# Patient Record
Sex: Female | Born: 1980 | Race: White | Hispanic: No | Smoking: Never smoker
Health system: Southern US, Community
[De-identification: ages and names within clinical notes are randomized; demographics above are authoritative.]

## PROBLEM LIST (undated history)

## (undated) DIAGNOSIS — H9190 Unspecified hearing loss, unspecified ear: Secondary | ICD-10-CM

## (undated) HISTORY — PX: MIDDLE EAR SURGERY: SHX713

## (undated) HISTORY — DX: Unspecified hearing loss, unspecified ear: H91.90

---

## 2007-03-19 ENCOUNTER — Encounter: Admission: RE | Admit: 2007-03-19 | Discharge: 2007-03-19 | Payer: Self-pay | Admitting: Family Medicine

## 2008-02-26 ENCOUNTER — Inpatient Hospital Stay (HOSPITAL_COMMUNITY): Admission: AD | Admit: 2008-02-26 | Discharge: 2008-02-28 | Payer: Self-pay | Admitting: Obstetrics and Gynecology

## 2009-03-13 ENCOUNTER — Encounter: Admission: RE | Admit: 2009-03-13 | Discharge: 2009-03-13 | Payer: Self-pay | Admitting: Family Medicine

## 2009-09-14 ENCOUNTER — Encounter: Admission: RE | Admit: 2009-09-14 | Discharge: 2009-09-14 | Payer: Self-pay | Admitting: Family Medicine

## 2010-07-17 ENCOUNTER — Observation Stay (HOSPITAL_COMMUNITY)
Admission: RE | Admit: 2010-07-17 | Discharge: 2010-07-17 | Disposition: A | Discharge status: Home / Self Care | Payer: Managed Care, Other (non HMO) | Source: Ambulatory Visit | Attending: Obstetrics and Gynecology | Admitting: Obstetrics and Gynecology

## 2010-07-17 DIAGNOSIS — O4700 False labor before 37 completed weeks of gestation, unspecified trimester: Principal | ICD-10-CM | POA: Insufficient documentation

## 2010-07-17 DIAGNOSIS — R1031 Right lower quadrant pain: Secondary | ICD-10-CM | POA: Insufficient documentation

## 2010-08-08 ENCOUNTER — Inpatient Hospital Stay (HOSPITAL_COMMUNITY)
Admission: AD | Admit: 2010-08-08 | Payer: Self-pay | Source: Home / Self Care | Attending: Obstetrics and Gynecology | Admitting: Obstetrics and Gynecology

## 2010-08-08 ENCOUNTER — Inpatient Hospital Stay (HOSPITAL_COMMUNITY)
Admission: AD | Admit: 2010-08-08 | Payer: Managed Care, Other (non HMO) | Source: Ambulatory Visit | Admitting: Obstetrics and Gynecology

## 2010-08-09 ENCOUNTER — Inpatient Hospital Stay (HOSPITAL_COMMUNITY)
Admission: RE | Admit: 2010-08-09 | Discharge: 2010-08-10 | DRG: 775 | Disposition: A | Discharge status: Home / Self Care | Payer: Managed Care, Other (non HMO) | Source: Ambulatory Visit | Attending: Obstetrics and Gynecology | Admitting: Obstetrics and Gynecology

## 2010-08-09 DIAGNOSIS — O480 Post-term pregnancy: Principal | ICD-10-CM | POA: Diagnosis present

## 2010-08-09 LAB — CBC
Hemoglobin: 13.9 g/dL (ref 12.0–15.0)
MCHC: 34.3 g/dL (ref 30.0–36.0)
MCV: 92.7 fL (ref 78.0–100.0)

## 2010-08-10 LAB — CBC
HCT: 30.3 % — ABNORMAL LOW (ref 36.0–46.0)
MCHC: 33 g/dL (ref 30.0–36.0)
Platelets: 164 10*3/uL (ref 150–400)

## 2010-12-08 IMAGING — US US PELVIS COMPLETE
1 series · 13 of 25 positions shown · non-contrast
Comparison: Ultrasound of the pelvis of 03/19/2007

CLINICAL DATA: Right lower quadrant tenderness

TRANSABDOMINAL AND TRANSVAGINAL ULTRASOUND OF PELVIS
TECHNIQUE: Both transabdominal and transvaginal ultrasound
examinations of the pelvis were performed including evaluation of
the uterus, ovaries, adnexal regions, and pelvic cul-de-sac.

[Series 1: us pelvis complete · 0.24mm/px · 13 of 89 slices shown]
[im 1/89]
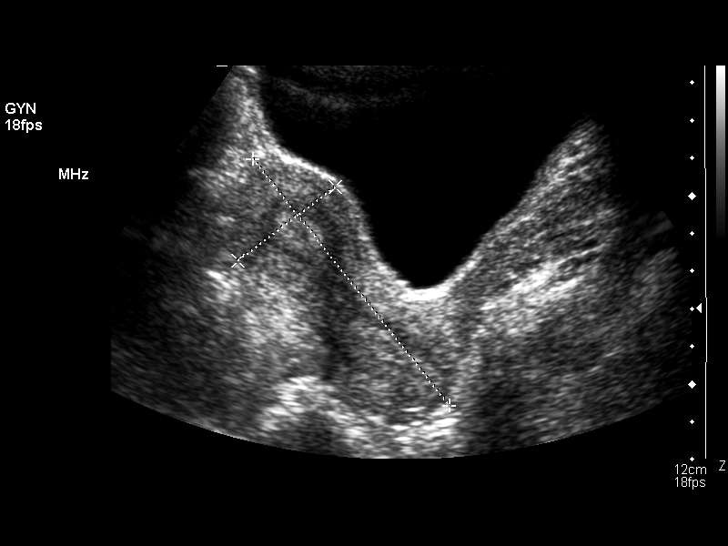
[im 8/89]
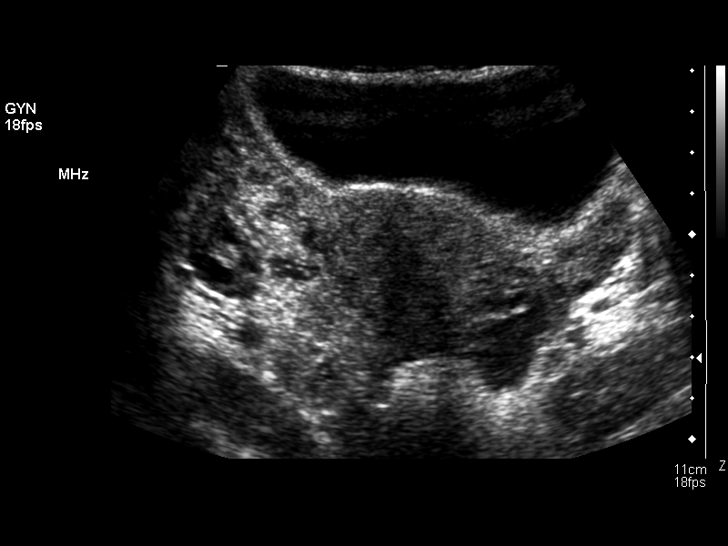
[im 15/89]
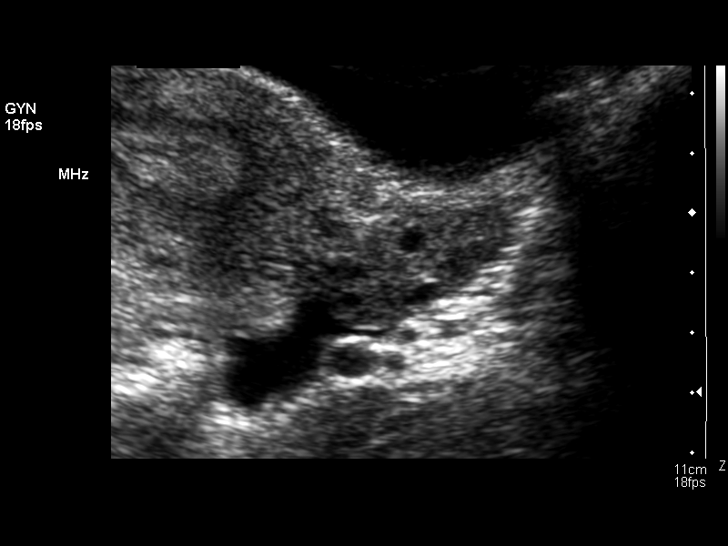
[im 23/89]
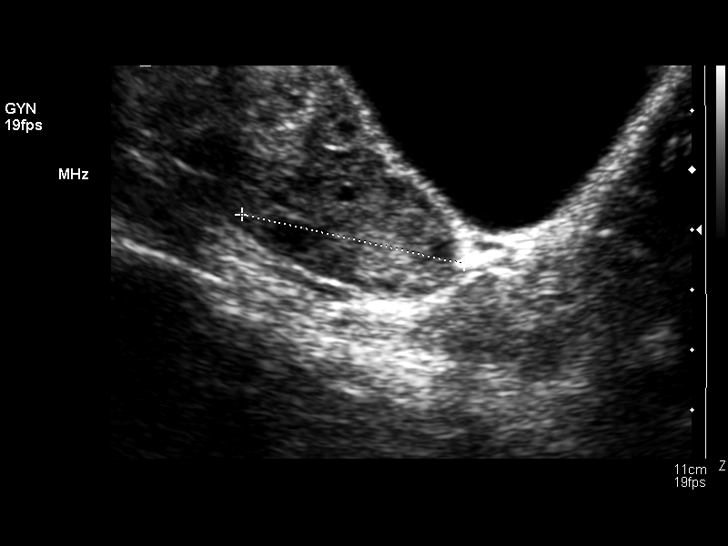
[im 30/89]
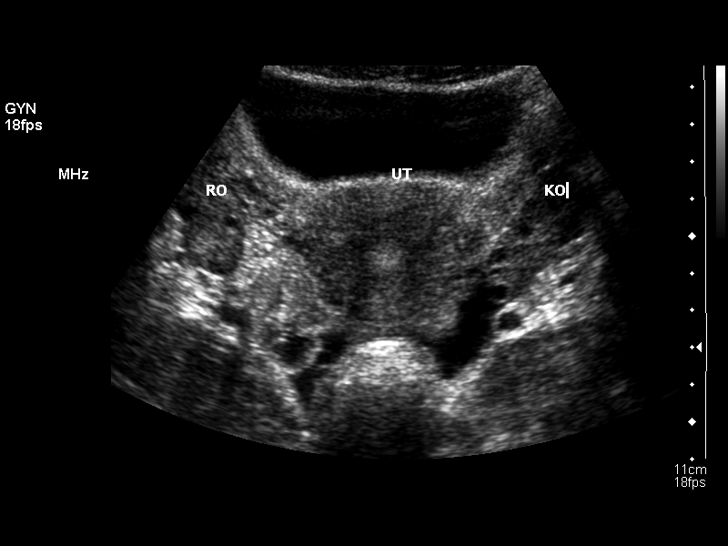
[im 37/89]
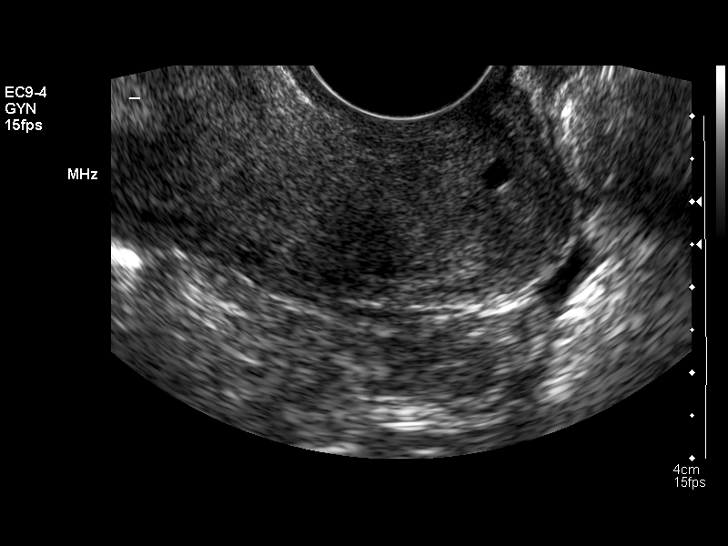
[im 45/89]
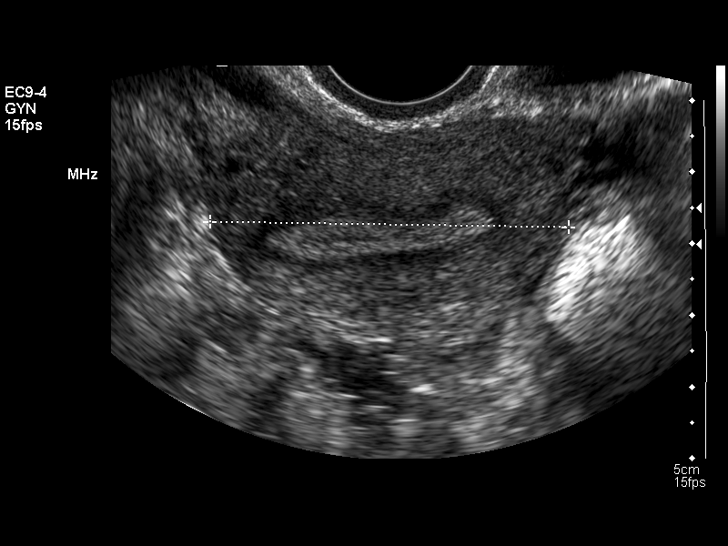
[im 52/89]
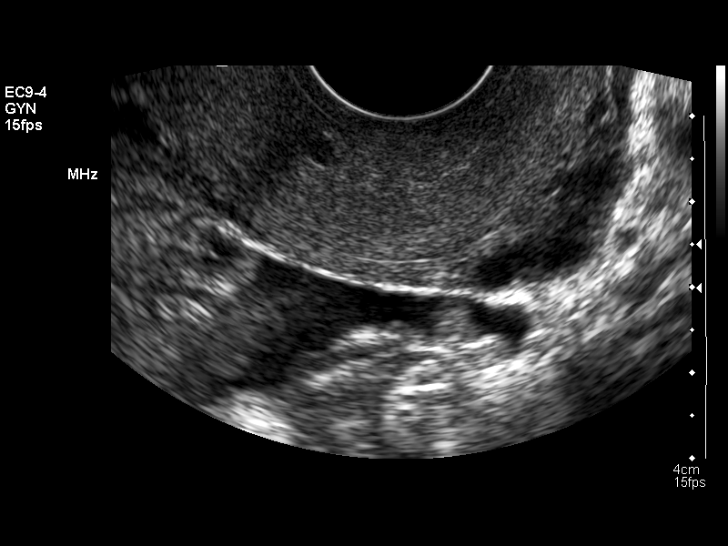
[im 59/89]
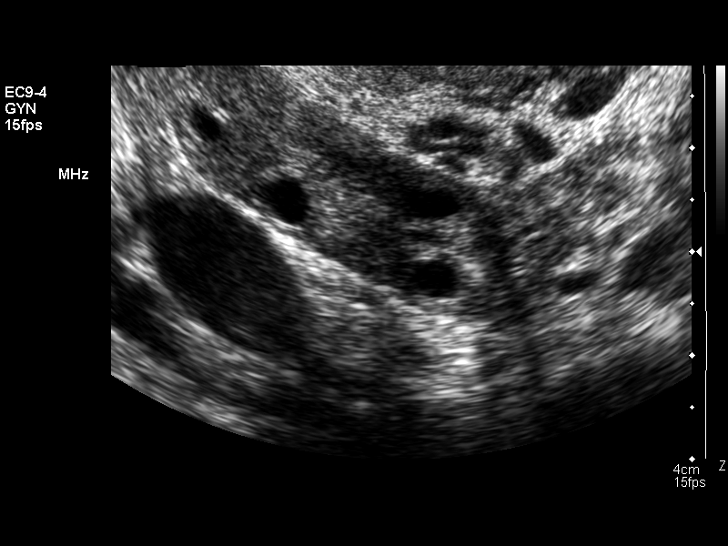
[im 67/89]
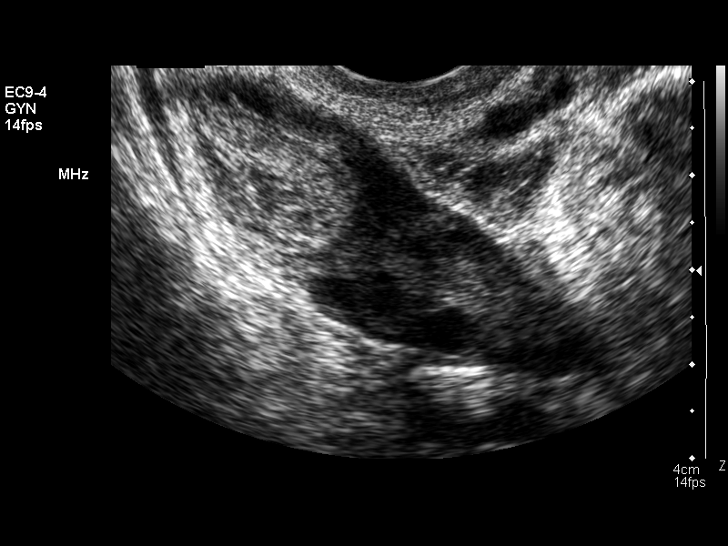
[im 74/89]
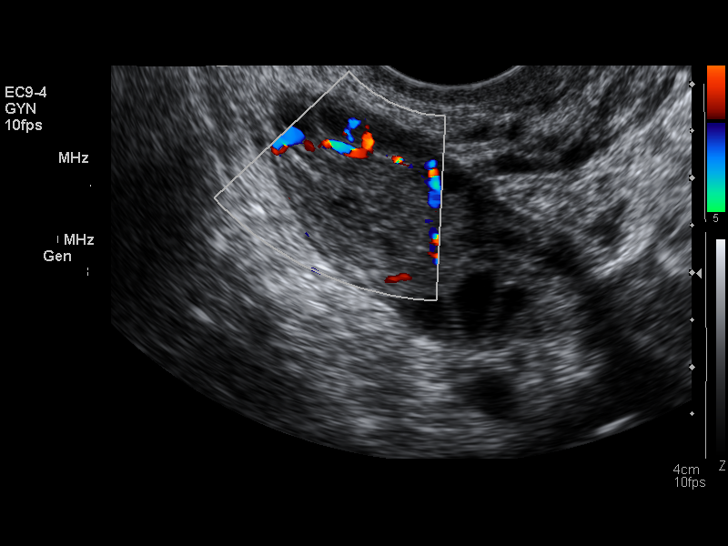
[im 81/89]
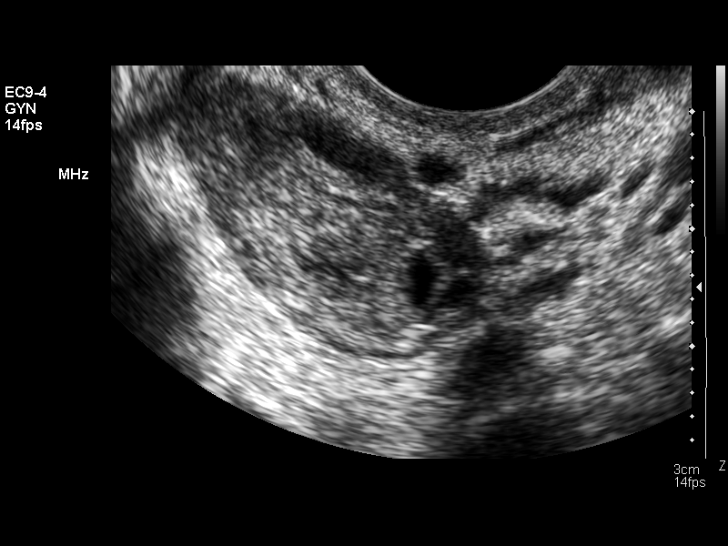
[im 89/89]
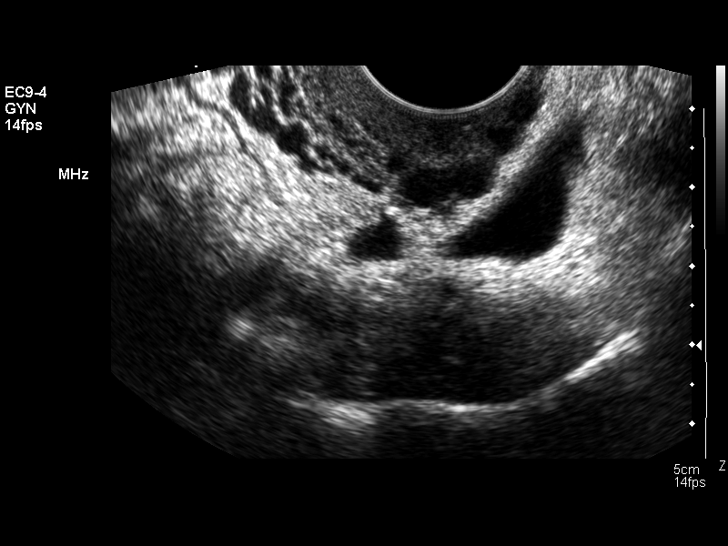

[13 of 25 positions shown; findings below may reference images not displayed]

FINDINGS: Uterus: The uterus measures 7.8 cm sagittally, with a depth of
cm and width of 5.0 cm.

Endometrium:The endometrium is normal measuring 6.6 mm in
thickness.

Right Ovary :The right ovary measures 5.2 x 1.7 x 2.9 cm.  There is
a slightly echogenic focus in the right ovary of 1.5 x 2.0 x
cm.  This does appear to have blood flow and therefore is not
consistent with a collapsed cyst.  Follow-up ultrasound is
recommended to assess stability.  Small follicles are present.

Left Ovary :The left ovary measures 4.6 x 1.4 x 2.3 cm with small
follicles.

Other Findings:  A small amount of free fluid is noted in the cul-
de-sac.
IMPRESSION: 1.  No abnormality of the uterus.  The endometrium is normal.
2.  Echogenic focus in the right ovary with some blood flow.
Recommend follow-up ultrasound to assess stability.
3.  Small ovarian follicles with small amount of free fluid.

## 2011-02-12 LAB — CBC
HCT: 45.5
Hemoglobin: 14.1
Hemoglobin: 15.3 — ABNORMAL HIGH
MCHC: 33.6
MCHC: 33.7
MCV: 96.2
Platelets: 215
Platelets: 246
RBC: 4.36
RBC: 4.72
RDW: 12.9
WBC: 14.4 — ABNORMAL HIGH
WBC: 20.5 — ABNORMAL HIGH

## 2011-02-12 LAB — RPR: RPR Ser Ql: NONREACTIVE

## 2011-09-19 LAB — OB RESULTS CONSOLE HEPATITIS B SURFACE ANTIGEN: Hepatitis B Surface Ag: NEGATIVE

## 2011-09-19 LAB — OB RESULTS CONSOLE RPR: RPR: NONREACTIVE

## 2012-03-17 LAB — OB RESULTS CONSOLE GBS: GBS: NEGATIVE

## 2012-03-22 ENCOUNTER — Inpatient Hospital Stay (HOSPITAL_COMMUNITY): Admission: AD | Admit: 2012-03-22 | Payer: Self-pay | Source: Ambulatory Visit | Admitting: Obstetrics and Gynecology

## 2012-04-03 ENCOUNTER — Telehealth (HOSPITAL_COMMUNITY): Payer: Self-pay | Admitting: *Deleted

## 2012-04-03 ENCOUNTER — Encounter (HOSPITAL_COMMUNITY): Payer: Self-pay | Admitting: *Deleted

## 2012-04-03 NOTE — Telephone Encounter (Signed)
Preadmission screen  

## 2012-04-05 ENCOUNTER — Encounter (HOSPITAL_COMMUNITY): Payer: Self-pay

## 2012-04-05 ENCOUNTER — Inpatient Hospital Stay (HOSPITAL_COMMUNITY)
Admission: RE | Admit: 2012-04-05 | Discharge: 2012-04-08 | DRG: 767 | Disposition: A | Discharge status: Home / Self Care | Payer: Managed Care, Other (non HMO) | Source: Ambulatory Visit | Attending: Obstetrics and Gynecology | Admitting: Obstetrics and Gynecology

## 2012-04-05 DIAGNOSIS — Z302 Encounter for sterilization: Secondary | ICD-10-CM

## 2012-04-05 LAB — CBC
Platelets: 189 10*3/uL (ref 150–400)
RBC: 4.43 MIL/uL (ref 3.87–5.11)
RDW: 12.7 % (ref 11.5–15.5)
WBC: 11.6 10*3/uL — ABNORMAL HIGH (ref 4.0–10.5)

## 2012-04-05 MED ORDER — LIDOCAINE HCL (PF) 1 % IJ SOLN: 30.0000 mL | INTRAMUSCULAR | Status: DC | PRN

## 2012-04-05 MED ORDER — CITRIC ACID-SODIUM CITRATE 334-500 MG/5ML PO SOLN
30.0000 mL | ORAL | Status: DC | PRN
  Administered 2012-04-06: 30 mL via ORAL
  Filled 2012-04-05: qty 15

## 2012-04-05 MED ORDER — ONDANSETRON HCL 4 MG/2ML IJ SOLN
4.0000 mg | Freq: Four times a day (QID) | INTRAMUSCULAR | Status: DC | PRN
  Administered 2012-04-06: 4 mg via INTRAVENOUS
  Filled 2012-04-05 (×2): qty 2

## 2012-04-05 MED ORDER — OXYTOCIN BOLUS FROM INFUSION
500.0000 mL | INTRAVENOUS | Status: DC
  Administered 2012-04-06: 500 mL via INTRAVENOUS

## 2012-04-05 MED ORDER — TERBUTALINE SULFATE 1 MG/ML IJ SOLN: 0.2500 mg | Freq: Once | INTRAMUSCULAR | Status: AC | PRN

## 2012-04-05 MED ORDER — OXYCODONE-ACETAMINOPHEN 5-325 MG PO TABS: 1.0000 | ORAL_TABLET | ORAL | Status: DC | PRN

## 2012-04-05 MED ORDER — LACTATED RINGERS IV SOLN: 500.0000 mL | INTRAVENOUS | Status: DC | PRN

## 2012-04-05 MED ORDER — FLEET ENEMA 7-19 GM/118ML RE ENEM: 1.0000 | ENEMA | RECTAL | Status: DC | PRN

## 2012-04-05 MED ORDER — OXYTOCIN 40 UNITS IN LACTATED RINGERS INFUSION - SIMPLE MED: 62.5000 mL/h | INTRAVENOUS | Status: DC

## 2012-04-05 MED ORDER — ACETAMINOPHEN 325 MG PO TABS
650.0000 mg | ORAL_TABLET | ORAL | Status: DC | PRN
  Administered 2012-04-06: 650 mg via ORAL
  Filled 2012-04-05: qty 2

## 2012-04-05 MED ORDER — LACTATED RINGERS IV SOLN
INTRAVENOUS | Status: DC
  Administered 2012-04-06: 08:00:00 via INTRAVENOUS

## 2012-04-05 MED ORDER — OXYTOCIN 40 UNITS IN LACTATED RINGERS INFUSION - SIMPLE MED
1.0000 m[IU]/min | INTRAVENOUS | Status: DC
  Administered 2012-04-05: 1 m[IU]/min via INTRAVENOUS
  Filled 2012-04-05: qty 1000

## 2012-04-05 MED ORDER — IBUPROFEN 600 MG PO TABS: 600.0000 mg | ORAL_TABLET | Freq: Four times a day (QID) | ORAL | Status: DC | PRN

## 2012-04-06 ENCOUNTER — Inpatient Hospital Stay (HOSPITAL_COMMUNITY): Payer: Managed Care, Other (non HMO) | Admitting: Anesthesiology

## 2012-04-06 ENCOUNTER — Encounter (HOSPITAL_COMMUNITY): Payer: Self-pay | Admitting: Anesthesiology

## 2012-04-06 ENCOUNTER — Encounter (HOSPITAL_COMMUNITY): Payer: Self-pay

## 2012-04-06 LAB — RPR: RPR Ser Ql: NONREACTIVE

## 2012-04-06 MED ORDER — DIPHENHYDRAMINE HCL 25 MG PO CAPS: 25.0000 mg | ORAL_CAPSULE | Freq: Four times a day (QID) | ORAL | Status: DC | PRN

## 2012-04-06 MED ORDER — PHENYLEPHRINE 40 MCG/ML (10ML) SYRINGE FOR IV PUSH (FOR BLOOD PRESSURE SUPPORT): 80.0000 ug | PREFILLED_SYRINGE | INTRAVENOUS | Status: DC | PRN

## 2012-04-06 MED ORDER — WITCH HAZEL-GLYCERIN EX PADS: 1.0000 "application " | MEDICATED_PAD | CUTANEOUS | Status: DC | PRN

## 2012-04-06 MED ORDER — IBUPROFEN 600 MG PO TABS
600.0000 mg | ORAL_TABLET | Freq: Four times a day (QID) | ORAL | Status: DC
  Administered 2012-04-06 – 2012-04-08 (×7): 600 mg via ORAL
  Filled 2012-04-06 (×9): qty 1

## 2012-04-06 MED ORDER — PRENATAL MULTIVITAMIN CH
1.0000 | ORAL_TABLET | Freq: Every day | ORAL | Status: DC
  Administered 2012-04-08: 1 via ORAL
  Filled 2012-04-06: qty 1

## 2012-04-06 MED ORDER — BENZOCAINE-MENTHOL 20-0.5 % EX AERO
1.0000 "application " | INHALATION_SPRAY | CUTANEOUS | Status: DC | PRN
  Administered 2012-04-06: 1 via TOPICAL
  Filled 2012-04-06 (×2): qty 56

## 2012-04-06 MED ORDER — SIMETHICONE 80 MG PO CHEW: 80.0000 mg | CHEWABLE_TABLET | ORAL | Status: DC | PRN

## 2012-04-06 MED ORDER — OXYCODONE-ACETAMINOPHEN 5-325 MG PO TABS
1.0000 | ORAL_TABLET | ORAL | Status: DC | PRN
Start: 2012-04-06 — End: 2012-04-08
  Administered 2012-04-07: 2 via ORAL
  Administered 2012-04-07 – 2012-04-08 (×4): 1 via ORAL
  Filled 2012-04-06 (×5): qty 1
  Filled 2012-04-06: qty 2

## 2012-04-06 MED ORDER — PHENYLEPHRINE 40 MCG/ML (10ML) SYRINGE FOR IV PUSH (FOR BLOOD PRESSURE SUPPORT)
80.0000 ug | PREFILLED_SYRINGE | INTRAVENOUS | Status: DC | PRN
  Filled 2012-04-06: qty 5

## 2012-04-06 MED ORDER — EPHEDRINE 5 MG/ML INJ
10.0000 mg | INTRAVENOUS | Status: AC | PRN
  Administered 2012-04-06 (×2): 10 mg via INTRAVENOUS

## 2012-04-06 MED ORDER — ZOLPIDEM TARTRATE 5 MG PO TABS: 5.0000 mg | ORAL_TABLET | Freq: Every evening | ORAL | Status: DC | PRN

## 2012-04-06 MED ORDER — DIPHENHYDRAMINE HCL 50 MG/ML IJ SOLN: 12.5000 mg | INTRAMUSCULAR | Status: DC | PRN

## 2012-04-06 MED ORDER — LANOLIN HYDROUS EX OINT: TOPICAL_OINTMENT | CUTANEOUS | Status: DC | PRN

## 2012-04-06 MED ORDER — DIBUCAINE 1 % RE OINT: 1.0000 "application " | TOPICAL_OINTMENT | RECTAL | Status: DC | PRN

## 2012-04-06 MED ORDER — ONDANSETRON HCL 4 MG/2ML IJ SOLN: 4.0000 mg | INTRAMUSCULAR | Status: DC | PRN

## 2012-04-06 MED ORDER — EPHEDRINE 5 MG/ML INJ
10.0000 mg | INTRAVENOUS | Status: DC | PRN
  Filled 2012-04-06 (×2): qty 4

## 2012-04-06 MED ORDER — ONDANSETRON HCL 4 MG PO TABS: 4.0000 mg | ORAL_TABLET | ORAL | Status: DC | PRN

## 2012-04-06 MED ORDER — LIDOCAINE HCL (PF) 1 % IJ SOLN
INTRAMUSCULAR | Status: DC | PRN
  Administered 2012-04-06 (×2): 5 mL

## 2012-04-06 MED ORDER — FENTANYL 2.5 MCG/ML BUPIVACAINE 1/10 % EPIDURAL INFUSION (WH - ANES)
14.0000 mL/h | INTRAMUSCULAR | Status: DC
  Administered 2012-04-06 (×2): 14 mL/h via EPIDURAL
  Filled 2012-04-06 (×2): qty 125

## 2012-04-06 MED ORDER — LACTATED RINGERS IV SOLN
500.0000 mL | Freq: Once | INTRAVENOUS | Status: AC
  Administered 2012-04-06: 500 mL via INTRAVENOUS

## 2012-04-06 MED ORDER — TETANUS-DIPHTH-ACELL PERTUSSIS 5-2.5-18.5 LF-MCG/0.5 IM SUSP: 0.5000 mL | Freq: Once | INTRAMUSCULAR | Status: DC

## 2012-04-06 MED ORDER — SENNOSIDES-DOCUSATE SODIUM 8.6-50 MG PO TABS
2.0000 | ORAL_TABLET | Freq: Every day | ORAL | Status: DC
  Administered 2012-04-06 – 2012-04-07 (×2): 2 via ORAL

## 2012-04-06 NOTE — Op Note (Signed)
Vaginal delivery of vigorous female infant.  APGARs 8, 9.  Weight and pH pending.   Head delivered ROA with body delivered atraumatically.  Mouth and nose bulb suctioned.  Cord clamped, cut and baby to abdomen.  Cord pH obtained.  Placenta delivered S/I/3VC.  Fundus firmed with pitocin and massage.  2nd degree perineal laceration repaired with 3-0 Rapide in the normal fashion.  Mom and baby stable.    Mitchel Honour, DO

## 2012-04-06 NOTE — Anesthesia Procedure Notes (Signed)
Epidural Patient location during procedure: OB Start time: 04/06/2012 1:36 AM  Staffing Anesthesiologist: Brayton Caves R Performed by: anesthesiologist   Preanesthetic Checklist Completed: patient identified, site marked, surgical consent, pre-op evaluation, timeout performed, IV checked, risks and benefits discussed and monitors and equipment checked  Epidural Patient position: sitting Prep: site prepped and draped and DuraPrep Patient monitoring: continuous pulse ox and blood pressure Approach: midline Injection technique: LOR air and LOR saline  Needle:  Needle type: Tuohy  Needle gauge: 17 G Needle length: 9 cm and 9 Needle insertion depth: 5 cm cm Catheter type: closed end flexible Catheter size: 19 Gauge Catheter at skin depth: 10 cm Test dose: negative  Assessment Events: blood not aspirated, injection not painful, no injection resistance, negative IV test and no paresthesia  Additional Notes Patient identified.  Risk benefits discussed including failed block, incomplete pain control, headache, nerve damage, paralysis, blood pressure changes, nausea, vomiting, reactions to medication both toxic or allergic, and postpartum back pain.  Patient expressed understanding and wished to proceed.  All questions were answered.  Sterile technique used throughout procedure and epidural site dressed with sterile barrier dressing. No paresthesia or other complications noted.The patient did not experience any signs of intravascular injection such as tinnitus or metallic taste in mouth nor signs of intrathecal spread such as rapid motor block. Please see nursing notes for vital signs.

## 2012-04-06 NOTE — Anesthesia Postprocedure Evaluation (Signed)
Anesthesia Post Note  Patient: Mackenzie Boone  Procedure(s) Performed: * No procedures listed *  Anesthesia type: Epidural  Patient location: Mother/Baby  Post pain: Pain level controlled  Post assessment: Post-op Vital signs reviewed  Last Vitals:  Filed Vitals:   04/06/12 1320  BP: 109/65  Pulse: 74  Temp: 37.2 C  Resp: 18    Post vital signs: Reviewed  Level of consciousness: awake  Complications: No apparent anesthesia complications

## 2012-04-06 NOTE — H&P (Signed)
Mackenzie Boone is a 31 y.o. female presenting for IOL secondary to h/o fast labor and favorable cervix at [redacted]w[redacted]d.  Comfortable with epidural.   Maternal Medical History:  Contractions: Onset was more than 2 days ago.   Frequency: irregular.   Perceived severity is mild.    Fetal activity: Perceived fetal activity is normal.   Last perceived fetal movement was within the past hour.    Prenatal Complications - Diabetes: none.    OB History    Grav Para Term Preterm Abortions TAB SAB Ect Mult Living   3 2 2       2      Past Medical History  Diagnosis Date  . Hearing loss     profound reads lips   Past Surgical History  Procedure Date  . Middle ear surgery     bilateral at age 64   Family History: family history includes Diabetes in her paternal grandfather. Social History:  reports that she has never smoked. She has never used smokeless tobacco. She reports that she does not drink alcohol or use illicit drugs.   Prenatal Transfer Tool  Maternal Diabetes: No Genetic Screening: Normal Maternal Ultrasounds/Referrals: Normal Fetal Ultrasounds or other Referrals:  None Maternal Substance Abuse:  No Significant Maternal Medications:  None Significant Maternal Lab Results:  None Other Comments:  None  ROS  Dilation: 4 Effacement (%): 80 Station: -2 Exam by:: T. Sprague RN Blood pressure 101/55, pulse 72, temperature 98.1 F (36.7 C), temperature source Oral, resp. rate 16, height 5\' 7"  (1.702 m), weight 173 lb (78.472 kg), SpO2 99.00%. Maternal Exam:  Uterine Assessment: Contraction strength is mild.  Contraction frequency is irregular.   Abdomen: Patient reports no abdominal tenderness. Fundal height is c/w dates.   Estimated fetal weight is 7#8.   Fetal presentation: vertex  Pelvis: adequate for delivery.   Cervix: Cervix evaluated by digital exam.     Physical Exam  Constitutional: She is oriented to person, place, and time. She appears well-developed and  well-nourished.  HENT:  Head: Normocephalic and atraumatic.  Neck: Normal range of motion. Neck supple.  GI: Soft. There is no rebound and no guarding.  Neurological: She is alert and oriented to person, place, and time.  Skin: Skin is warm and dry.  Psychiatric: She has a normal mood and affect. Her behavior is normal.    Prenatal labs: ABO, Rh: A/Positive/-- (05/09 0000) Antibody: Negative (05/09 0000) Rubella: Immune (05/09 0000) RPR: Nonreactive (05/09 0000)  HBsAg: Negative (05/09 0000)  HIV: Non-reactive (05/09 0000)  GBS: Negative (11/05 0000)   Assessment/Plan: 31yo [id number] at [redacted]w[redacted]d for IOL -Pitocin overnight; plan to AROM this AM -Anticipate NSVD   Mackenzie Boone 04/06/2012, 7:51 AM

## 2012-04-06 NOTE — Progress Notes (Signed)
AROM performed with scant clear fluid.   SVE: 4/50/-2  Will continue pitocin.    Mitchel Honour, DO

## 2012-04-06 NOTE — Anesthesia Preprocedure Evaluation (Signed)

## 2012-04-07 ENCOUNTER — Encounter (HOSPITAL_COMMUNITY)
Admission: RE | Disposition: A | Discharge status: Home / Self Care | Payer: Self-pay | Source: Ambulatory Visit | Attending: Obstetrics and Gynecology

## 2012-04-07 ENCOUNTER — Encounter (HOSPITAL_COMMUNITY): Payer: Self-pay | Admitting: Anesthesiology

## 2012-04-07 ENCOUNTER — Inpatient Hospital Stay (HOSPITAL_COMMUNITY): Payer: Managed Care, Other (non HMO) | Admitting: Anesthesiology

## 2012-04-07 HISTORY — PX: TUBAL LIGATION: SHX77

## 2012-04-07 LAB — MRSA PCR SCREENING: MRSA by PCR: NEGATIVE

## 2012-04-07 LAB — CBC
Hemoglobin: 12.1 g/dL (ref 12.0–15.0)
Platelets: 162 10*3/uL (ref 150–400)
RBC: 3.79 MIL/uL — ABNORMAL LOW (ref 3.87–5.11)
WBC: 11.7 10*3/uL — ABNORMAL HIGH (ref 4.0–10.5)

## 2012-04-07 SURGERY — LIGATION, FALLOPIAN TUBE, POSTPARTUM
Anesthesia: Spinal | Site: Abdomen | Laterality: Bilateral | Wound class: Clean Contaminated

## 2012-04-07 MED ORDER — BUPIVACAINE HCL (PF) 0.25 % IJ SOLN
INTRAMUSCULAR | Status: DC | PRN
  Administered 2012-04-07: 10 mL

## 2012-04-07 MED ORDER — PROPOFOL 10 MG/ML IV BOLUS
INTRAVENOUS | Status: DC | PRN
  Administered 2012-04-07 (×2): 15 ug via INTRAVENOUS
  Administered 2012-04-07: 20 ug via INTRAVENOUS

## 2012-04-07 MED ORDER — BUPIVACAINE IN DEXTROSE 0.75-8.25 % IT SOLN
INTRATHECAL | Status: DC | PRN
  Administered 2012-04-07: 1 mL via INTRATHECAL

## 2012-04-07 MED ORDER — LACTATED RINGERS IV SOLN
INTRAVENOUS | Status: DC
  Administered 2012-04-07: 11:00:00 via INTRAVENOUS

## 2012-04-07 MED ORDER — PROPOFOL 10 MG/ML IV EMUL
INTRAVENOUS | Status: AC
  Filled 2012-04-07: qty 20

## 2012-04-07 MED ORDER — METOCLOPRAMIDE HCL 10 MG PO TABS
10.0000 mg | ORAL_TABLET | Freq: Once | ORAL | Status: AC
  Administered 2012-04-07: 10 mg via ORAL
  Filled 2012-04-07: qty 1

## 2012-04-07 MED ORDER — FENTANYL CITRATE 0.05 MG/ML IJ SOLN: 25.0000 ug | INTRAMUSCULAR | Status: DC | PRN

## 2012-04-07 MED ORDER — KETOROLAC TROMETHAMINE 30 MG/ML IJ SOLN
INTRAMUSCULAR | Status: AC
  Filled 2012-04-07: qty 1

## 2012-04-07 MED ORDER — MIDAZOLAM HCL 2 MG/2ML IJ SOLN
INTRAMUSCULAR | Status: AC
  Filled 2012-04-07: qty 2

## 2012-04-07 MED ORDER — FAMOTIDINE 20 MG PO TABS
40.0000 mg | ORAL_TABLET | Freq: Once | ORAL | Status: AC
  Administered 2012-04-07: 40 mg via ORAL
  Filled 2012-04-07: qty 2

## 2012-04-07 MED ORDER — LIDOCAINE HCL (CARDIAC) 20 MG/ML IV SOLN
INTRAVENOUS | Status: DC | PRN
  Administered 2012-04-07: 20 mg via INTRAVENOUS
  Administered 2012-04-07: 10 mg via INTRAVENOUS

## 2012-04-07 MED ORDER — KETOROLAC TROMETHAMINE 30 MG/ML IJ SOLN
15.0000 mg | Freq: Once | INTRAMUSCULAR | Status: AC | PRN
  Administered 2012-04-07: 30 mg via INTRAVENOUS

## 2012-04-07 MED ORDER — MIDAZOLAM HCL 5 MG/5ML IJ SOLN
INTRAMUSCULAR | Status: DC | PRN
  Administered 2012-04-07: 2 mg via INTRAVENOUS

## 2012-04-07 MED ORDER — LIDOCAINE HCL (CARDIAC) 20 MG/ML IV SOLN
INTRAVENOUS | Status: AC
  Filled 2012-04-07: qty 5

## 2012-04-07 MED ORDER — BUPIVACAINE HCL (PF) 0.25 % IJ SOLN
INTRAMUSCULAR | Status: AC
  Filled 2012-04-07: qty 30

## 2012-04-07 SURGICAL SUPPLY — 21 items
CHLORAPREP W/TINT 26ML (MISCELLANEOUS) ×4 IMPLANT
CLOTH BEACON ORANGE TIMEOUT ST (SAFETY) ×2 IMPLANT
CONTAINER PREFILL 10% NBF 15ML (MISCELLANEOUS) ×4 IMPLANT
DERMABOND ADVANCED (GAUZE/BANDAGES/DRESSINGS) ×1
DERMABOND ADVANCED .7 DNX12 (GAUZE/BANDAGES/DRESSINGS) ×1 IMPLANT
GLOVE BIO SURGEON STRL SZ 6.5 (GLOVE) ×2 IMPLANT
GLOVE BIOGEL PI IND STRL 7.0 (GLOVE) ×2 IMPLANT
GLOVE BIOGEL PI INDICATOR 7.0 (GLOVE) ×2
GOWN PREVENTION PLUS LG XLONG (DISPOSABLE) ×4 IMPLANT
NEEDLE HYPO 25X1 1.5 SAFETY (NEEDLE) ×2 IMPLANT
NS IRRIG 1000ML POUR BTL (IV SOLUTION) ×2 IMPLANT
PACK ABDOMINAL MINOR (CUSTOM PROCEDURE TRAY) ×2 IMPLANT
SPONGE LAP 4X18 X RAY DECT (DISPOSABLE) ×2 IMPLANT
SUT PLAIN 0 NONE (SUTURE) ×2 IMPLANT
SUT VIC AB 0 CT2 27 (SUTURE) ×2 IMPLANT
SUT VIC AB 3-0 PS2 18 (SUTURE) ×1
SUT VIC AB 3-0 PS2 18XBRD (SUTURE) ×1 IMPLANT
SYR CONTROL 10ML LL (SYRINGE) ×2 IMPLANT
TOWEL OR 17X24 6PK STRL BLUE (TOWEL DISPOSABLE) ×4 IMPLANT
TRAY FOLEY CATH 14FR (SET/KITS/TRAYS/PACK) ×2 IMPLANT
WATER STERILE IRR 1000ML POUR (IV SOLUTION) ×2 IMPLANT

## 2012-04-07 NOTE — Progress Notes (Signed)
Pt counseled on risk of postpartum tubal ligation.  R/b/a discussed including risk of ectopic and permanence of procedure.  Informed consent obtained.

## 2012-04-07 NOTE — Anesthesia Procedure Notes (Signed)
Spinal  Patient location during procedure: OR Start time: 04/07/2012 1:21 PM Staffing Performed by: anesthesiologist  Preanesthetic Checklist Completed: patient identified, site marked, surgical consent, pre-op evaluation, timeout performed, IV checked, risks and benefits discussed and monitors and equipment checked Spinal Block Patient position: sitting Prep: site prepped and draped and DuraPrep Patient monitoring: heart rate, cardiac monitor, continuous pulse ox and blood pressure Approach: midline Location: L3-4 Injection technique: single-shot Needle Needle type: Sprotte  Needle gauge: 24 G Needle length: 9 cm Assessment Sensory level: T4 Additional Notes Clear free flow CSF on first attempt.  No paresthesia.  Patient tolerated procedure well.  Jasmine December, MD

## 2012-04-07 NOTE — Anesthesia Preprocedure Evaluation (Addendum)
Anesthesia Evaluation  Patient identified by MRN, date of birth, ID band Patient awake    Reviewed: Allergy & Precautions, H&P , Patient's Chart, lab work & pertinent test results  Airway Mallampati: II TM Distance: >3 FB Neck ROM: full    Dental No notable dental hx.    Pulmonary neg pulmonary ROS,  breath sounds clear to auscultation  Pulmonary exam normal       Cardiovascular negative cardio ROS  Rhythm:regular Rate:Normal     Neuro/Psych Profound hearing loss - reads lips negative psych ROS   GI/Hepatic negative GI ROS, Neg liver ROS,   Endo/Other  negative endocrine ROS  Renal/GU negative Renal ROS     Musculoskeletal   Abdominal   Peds  Hematology negative hematology ROS (+)   Anesthesia Other Findings Had epidural for labor - but it was removed  Reproductive/Obstetrics (+) Pregnancy                          Anesthesia Physical  Anesthesia Plan  ASA: II  Anesthesia Plan: Spinal   Post-op Pain Management:    Induction:   Airway Management Planned:   Additional Equipment:   Intra-op Plan:   Post-operative Plan:   Informed Consent: I have reviewed the patients History and Physical, chart, labs and discussed the procedure including the risks, benefits and alternatives for the proposed anesthesia with the patient or authorized representative who has indicated his/her understanding and acceptance.     Plan Discussed with: Surgeon and CRNA  Anesthesia Plan Comments:        Anesthesia Quick Evaluation

## 2012-04-07 NOTE — Transfer of Care (Signed)
Immediate Anesthesia Transfer of Care Note  Patient: Mackenzie Boone  Procedure(s) Performed: Procedure(s) (LRB) with comments: POST PARTUM TUBAL LIGATION (Bilateral)  Patient Location: PACU  Anesthesia Type:Spinal  Level of Consciousness: awake  Airway & Oxygen Therapy: Patient Spontanous Breathing  Post-op Assessment: Report given to PACU RN and Post -op Vital signs reviewed and stable  Post vital signs: Reviewed and stable  Complications: No apparent anesthesia complications

## 2012-04-07 NOTE — Progress Notes (Signed)
Post Partum Day 1 Subjective: no complaints, up ad lib, voiding and patient is NPO ,desires pp tubal ligation  Objective: Blood pressure 109/64, pulse 64, temperature 97.7 F (36.5 C), temperature source Oral, resp. rate 18, height 5\' 7"  (1.702 m), weight 78.472 kg (173 lb), SpO2 99.00%, unknown if currently breastfeeding.  Physical Exam:  General: alert and cooperative Lochia: appropriate Uterine Fundus: firm Incision: perineum intact DVT Evaluation: No evidence of DVT seen on physical exam. No cords or calf tenderness. No significant calf/ankle edema.   Basename 04/07/12 0505 04/05/12 2005  HGB 12.1 14.3  HCT 35.6* 41.0    Assessment/Plan: Plan for discharge tomorrow Remain NPO   LOS: 2 days   Pheobe Sandiford G 04/07/2012, 8:06 AM

## 2012-04-07 NOTE — Anesthesia Postprocedure Evaluation (Signed)
  Anesthesia Post-op Note  Patient: Mackenzie Boone  Procedure(s) Performed: Procedure(s) (LRB) with comments: POST PARTUM TUBAL LIGATION (Bilateral)  Patient Location: PACU  Anesthesia Type:Spinal  Level of Consciousness: awake, alert  and oriented  Airway and Oxygen Therapy: Patient Spontanous Breathing  Post-op Pain: none  Post-op Assessment: Post-op Vital signs reviewed, Patient's Cardiovascular Status Stable, Respiratory Function Stable, Patent Airway, No signs of Nausea or vomiting, Pain level controlled, No headache and No backache  Post-op Vital Signs: Reviewed and stable  Complications: No apparent anesthesia complications

## 2012-04-08 MED ORDER — OXYCODONE-ACETAMINOPHEN 5-325 MG PO TABS: 1.0000 | ORAL_TABLET | ORAL | Status: DC | PRN

## 2012-04-08 MED ORDER — IBUPROFEN 600 MG PO TABS: 600.0000 mg | ORAL_TABLET | Freq: Four times a day (QID) | ORAL | Status: DC

## 2012-04-08 NOTE — Op Note (Signed)
Mackenzie Boone, Mackenzie Boone            ACCOUNT NO.:  [id number]  MEDICAL RECORD NO.:  [id number]  LOCATION:  9120                          FACILITY:  [facility]  PHYSICIAN:  Zelphia Cairo, MD    DATE OF BIRTH:  [birth year]  DATE OF PROCEDURE:  04/07/2012 DATE OF DISCHARGE:                              OPERATIVE REPORT   PREOPERATIVE DIAGNOSES: 1. Multiparity. 2. Desires permanent sterilization.  POSTOPERATIVE DIAGNOSES: 1. Multiparity. 2. Desires permanent sterilization.  PROCEDURE:  Postpartum bilateral partial salpingectomy.  SURGEON:  Zelphia Cairo, MD  ANESTHESIA:  Spinal.  SPECIMEN:  Segments of bilateral fallopian tubes.  COMPLICATIONS:  None.  CONDITION:  Stable to recovery room.  BLOOD LOSS:  Minimal.  PROCEDURE:  [name] was taken to the operating room.  After informed consent was obtained, she was placed in the supine position after spinal anesthesia was given.  She was prepped and draped in sterile fashion. Once spinal anesthesia was found to be adequate, 0.25% Marcaine was used for local anesthesia and a small infraumbilical skin incision was made with a scalpel.  This was extended to the level of the peritoneum using a Kelly clamp.  The peritoneum was grasped with Kelly clamps and tented upwards.  This was entered sharply with Metzenbaum scissors.  Right angle retractors were placed into the incision and the patient's right fallopian tube was grasped with a Babcock clamp.  It was followed out to the fimbriated end.  A segment of fallopian tube was then grasped with a Babcock and brought to the incision.  A knuckle of tube was doubly ligated using plain gut suture.  Segment of fallopian tube was then excised using Metzenbaum.  Excellent hemostasis was noted.  Fallopian tube was placed back into the abdomen and this excised segment was passed off to be sent to Pathology.  Our attention was then focused on the left fallopian tube.  The left fallopian tube was then  grasped with a Babcock and carried out to the fimbriated end.  The knuckle of tube was doubly ligated using plain gut suture and excised using Metzenbaum scissors.  Excellent hemostasis was noted.  Segment was passed off to be sent to Pathology.  The peritoneum and fascia were then reapproximated using Vicryl.  The skin was closed with a subcutaneous stitch and Dermabond was placed over the incision.  The patient tolerated the procedure well.  She was taken to the recovery room in stable condition.     Zelphia Cairo, MD     GA/MEDQ  D:  04/07/2012  T:  04/08/2012  Job:  [id number]

## 2012-04-08 NOTE — Discharge Summary (Signed)
Obstetric Discharge Summary Reason for Admission: induction of labor Prenatal Procedures: ultrasound Intrapartum Procedures: spontaneous vaginal delivery Postpartum Procedures: P.P. tubal ligation Complications-Operative and Postpartum: none Hemoglobin  Date Value Range Status  04/07/2012 12.1  12.0 - 15.0 Boone/dL Final     HCT  Date Value Range Status  04/07/2012 35.6* 36.0 - 46.0 % Final    Physical Exam:  General: alert and cooperative Lochia: appropriate Uterine Fundus: firm Incision: healing well DVT Evaluation: No evidence of DVT seen on physical exam. No significant calf/ankle edema.  Discharge Diagnoses: Term Pregnancy-delivered  Discharge Information: Date: 04/08/2012 Activity: pelvic rest Diet: routine Medications: None, Ibuprofen and Percocet Condition: stable Instructions: refer to practice specific booklet Discharge to: home   Newborn Data: Live born female  Birth Weight: 7 lb 4.8 oz (3311 Boone) APGAR: 8, 9  Home with mother.  Mackenzie Boone 04/08/2012, 8:12 AM

## 2012-04-09 ENCOUNTER — Encounter (HOSPITAL_COMMUNITY): Payer: Self-pay | Admitting: Obstetrics and Gynecology

## 2013-08-04 ENCOUNTER — Other Ambulatory Visit: Payer: Self-pay | Admitting: Obstetrics and Gynecology

## 2014-03-14 ENCOUNTER — Encounter (HOSPITAL_COMMUNITY): Payer: Self-pay | Admitting: Obstetrics and Gynecology

## 2015-11-08 ENCOUNTER — Ambulatory Visit (INDEPENDENT_AMBULATORY_CARE_PROVIDER_SITE_OTHER): Payer: Medicaid Other

## 2015-11-08 ENCOUNTER — Encounter: Payer: Self-pay | Admitting: Podiatry

## 2015-11-08 ENCOUNTER — Ambulatory Visit (INDEPENDENT_AMBULATORY_CARE_PROVIDER_SITE_OTHER): Payer: Medicaid Other | Admitting: Podiatry

## 2015-11-08 VITALS — BP 119/72 | HR 75 | Resp 16

## 2015-11-08 DIAGNOSIS — M7750 Other enthesopathy of unspecified foot: Secondary | ICD-10-CM

## 2015-11-08 DIAGNOSIS — M779 Enthesopathy, unspecified: Secondary | ICD-10-CM

## 2015-11-08 DIAGNOSIS — M21619 Bunion of unspecified foot: Secondary | ICD-10-CM

## 2015-11-08 DIAGNOSIS — M216X9 Other acquired deformities of unspecified foot: Secondary | ICD-10-CM

## 2015-11-08 NOTE — Progress Notes (Signed)
Subjective:     Patient ID: Mackenzie Boone, female   DOB: June 23, 1980, 35 y.o.   MRN: [id number]  HPI patient presents with painful bunion deformity right over left and pain in the right ankle. Patient states it's been present for a long time she's tried wider shoes she's tried padding it and soak it without relief of symptoms   Review of Systems  All other systems reviewed and are negative.      Objective:   Physical Exam  Constitutional: She is oriented to person, place, and time.  Cardiovascular: Intact distal pulses.   Musculoskeletal: Normal range of motion.  Neurological: She is oriented to person, place, and time.  Skin: Skin is warm.  Nursing note and vitals reviewed.  neurovascular status found to be intact muscle strength adequate range of motion within normal limits with patient noted to have hyperostosis and redness around the medial aspect first metatarsal head right over left and discomfort in the sinus tarsi right with inflammation and fluid buildup. 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 At this point we discussed conservative and surgical treatments and she states it's been bothering her for a long time and she wants it fixed. I have recommended Austin-type osteotomy along with injection of the right sinus tarsi with consideration for correction of the left in future. I explained to her what would be required and recovery and she will reappoint for consult to discuss in greater detail and is tentatively scheduled for surgery in August  X-ray report indicates elevation of the intermetatarsal angle between the first and second metatarsals approximate 15 bilateral with hyperostosis on  the medial aspect first metatarsal head

## 2015-11-08 NOTE — Progress Notes (Signed)
   Subjective:    Patient ID: Mackenzie Boone, female    DOB: 1981-05-09, 35 y.o.   MRN: [id number]  HPI    Review of Systems  All other systems reviewed and are negative.      Objective:   Physical Exam        Assessment & Plan:

## 2015-11-17 ENCOUNTER — Ambulatory Visit (INDEPENDENT_AMBULATORY_CARE_PROVIDER_SITE_OTHER): Payer: Medicaid Other | Admitting: Podiatry

## 2015-11-17 ENCOUNTER — Encounter: Payer: Self-pay | Admitting: Podiatry

## 2015-11-17 DIAGNOSIS — M21619 Bunion of unspecified foot: Secondary | ICD-10-CM

## 2015-11-17 DIAGNOSIS — M216X9 Other acquired deformities of unspecified foot: Secondary | ICD-10-CM | POA: Diagnosis not present

## 2015-11-17 DIAGNOSIS — M7750 Other enthesopathy of unspecified foot: Secondary | ICD-10-CM | POA: Diagnosis not present

## 2015-11-17 DIAGNOSIS — M779 Enthesopathy, unspecified: Secondary | ICD-10-CM

## 2015-11-17 NOTE — Patient Instructions (Signed)

## 2015-11-19 NOTE — Progress Notes (Signed)
Subjective:     Patient ID: Mackenzie DankerWhitney Boone, female   DOB: 12-29-1980, 35 y.o.   MRN: [id number]  HPI patient presents with mother stating she wants to discuss surgery for her right foot as the bunion continues to bother her and the joint hurts to. 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 Patient will have Austin-type osteotomy right with pin and injection of the right sinus tarsi and the understand all complications as listed and the fact recovery can take from 6 months to one year. They signed consent form and are scheduled for outpatient surgery and were encouraged to call with any questions prior to procedure

## 2015-12-06 ENCOUNTER — Telehealth: Payer: Self-pay | Admitting: *Deleted

## 2015-12-06 NOTE — Telephone Encounter (Signed)
I'm calling to get your daughter's e-mail address.  You had requested communication be handled through e-mail but we do not have an e-mail address.  "Oh, I'm sorry.  Her e-mail address is [email or web address]."  Thank you so much.  I called and gave Aram Beecham at [facility] patient's e-mail address so she can contact patient regarding surgery.

## 2015-12-12 ENCOUNTER — Telehealth: Payer: Self-pay | Admitting: *Deleted

## 2015-12-12 ENCOUNTER — Encounter: Payer: Self-pay | Admitting: Podiatry

## 2015-12-12 DIAGNOSIS — M722 Plantar fascial fibromatosis: Secondary | ICD-10-CM | POA: Diagnosis not present

## 2015-12-12 DIAGNOSIS — M2011 Hallux valgus (acquired), right foot: Secondary | ICD-10-CM | POA: Diagnosis not present

## 2015-12-12 NOTE — Telephone Encounter (Addendum)
Received notice to prior authorization for Demerol. Left message informing pt I needed her to call concerning her medication. Faxed completed PA for [facility] Request for Prior Approval Narcotic Analgesic. Pt's Mtr, Gavin Pound states they purchased the Demerol and the pharmacist states he will reimburse once the prior approval had been received. 12/13/2015-Post op courtesy call-I spoke with pt's mtr, Gavin Pound, instructed to follow the post op instruction sheet, reiterated the instruction not to weight bear, dangle or have the foot below the heart more than 15 mins/hour, leave the original dressing in place clean and dry until the 1st POV when it would be changed, remain in the boot at all times especially to walk, and call with concerns. Gavin Pound informed pt and pt states she has no questions. 12/19/2015-Left message for [place] that I would refill the pain medication, and speak with her mom, because pt is not able to drive at this time, to have her pickup the medication. Informed pt's mtr, Gavin Pound of the refill Demerol and to pick up in the [place] office before. 5:00pm.

## 2015-12-19 MED ORDER — MEPERIDINE HCL 50 MG PO TABS: ORAL_TABLET | ORAL | 0 refills | Status: DC

## 2015-12-22 ENCOUNTER — Encounter: Payer: Self-pay | Admitting: Podiatry

## 2015-12-22 ENCOUNTER — Ambulatory Visit: Payer: Self-pay

## 2015-12-22 ENCOUNTER — Ambulatory Visit (INDEPENDENT_AMBULATORY_CARE_PROVIDER_SITE_OTHER): Payer: Medicaid Other | Admitting: Podiatry

## 2015-12-22 VITALS — BP 115/66 | HR 68 | Resp 12

## 2015-12-22 DIAGNOSIS — M779 Enthesopathy, unspecified: Secondary | ICD-10-CM

## 2015-12-22 DIAGNOSIS — Z9889 Other specified postprocedural states: Secondary | ICD-10-CM

## 2015-12-22 DIAGNOSIS — M21619 Bunion of unspecified foot: Secondary | ICD-10-CM | POA: Diagnosis not present

## 2015-12-26 NOTE — Progress Notes (Signed)
Subjective:     Patient ID: Mackenzie Boone, female   DOB: 29-Dec-1980, 35 y.o.   MRN: [id number]  HPI patient presents right foot stating that she's doing very well with the surgery and she's walking with a good gait   Review of Systems     Objective:   Physical Exam Neurovascular status intact muscle strength adequate with well coapted incision site right first metatarsal with good alignment noted and wound edges well coapted and joint congruence with good motion    Assessment:     Doing well post Austin osteotomy right injection right subtalar joint with negative Homans sign noted upon evaluation    Plan:     Reviewed condition and recommended that she continue with immobilization compression elevation and reappoint 3 weeks or earlier if needed  X-ray report was negative for signs of fracture and indicated that there is excellent alignment with good pin position

## 2016-01-11 ENCOUNTER — Telehealth: Payer: Self-pay | Admitting: *Deleted

## 2016-01-11 ENCOUNTER — Ambulatory Visit (INDEPENDENT_AMBULATORY_CARE_PROVIDER_SITE_OTHER): Payer: Medicaid Other

## 2016-01-11 ENCOUNTER — Ambulatory Visit (INDEPENDENT_AMBULATORY_CARE_PROVIDER_SITE_OTHER): Payer: Medicaid Other | Admitting: Podiatry

## 2016-01-11 DIAGNOSIS — M216X9 Other acquired deformities of unspecified foot: Secondary | ICD-10-CM

## 2016-01-11 DIAGNOSIS — Z9889 Other specified postprocedural states: Secondary | ICD-10-CM

## 2016-01-11 DIAGNOSIS — M21619 Bunion of unspecified foot: Secondary | ICD-10-CM

## 2016-01-11 MED ORDER — HYDROCODONE-ACETAMINOPHEN 5-325 MG PO TABS: 1.0000 | ORAL_TABLET | Freq: Four times a day (QID) | ORAL | 0 refills | Status: DC | PRN

## 2016-01-11 NOTE — Telephone Encounter (Signed)
Message was left for patient to call me to reschedule appointment.  Dr. Charlsie Merlesegal cannot do appointment scheduled for 05/07/2016.

## 2016-01-11 NOTE — Progress Notes (Signed)
DOS 12/12/2015 Austin bunionectomy right foot (cutting and moving bone) W/pin fixaton right foot, injection right ankle

## 2016-01-11 NOTE — Patient Instructions (Signed)
Pre-Operative Instructions  Congratulations, you have decided to take an important step to improving your quality of life.  You can be assured that the doctors of [facility] will be with you every step of the way.  1. Plan to be at the surgery center/hospital at least 1 (one) hour prior to your scheduled time unless otherwise directed by the surgical center/hospital staff.  You must have a responsible adult accompany you, remain during the surgery and drive you home.  Make sure you have directions to the surgical center/hospital and know how to get there on time. 2. For hospital based surgery you will need to obtain a history and physical form from your family physician within 1 month prior to the date of surgery- we will give you a form for you primary physician.  3. We make every effort to accommodate the date you request for surgery.  There are however, times where surgery dates or times have to be moved.  We will contact you as soon as possible if a change in schedule is required.   4. No Aspirin/Ibuprofen for one week before surgery.  If you are on aspirin, any non-steroidal anti-inflammatory medications (Mobic, Aleve, Ibuprofen) you should stop taking it 7 days prior to your surgery.  You make take Tylenol  For pain prior to surgery.  5. Medications- If you are taking daily heart and blood pressure medications, seizure, reflux, allergy, asthma, anxiety, pain or diabetes medications, make sure the surgery center/hospital is aware before the day of surgery so they may notify you which medications to take or avoid the day of surgery. 6. No food or drink after midnight the night before surgery unless directed otherwise by surgical center/hospital staff. 7. No alcoholic beverages 24 hours prior to surgery.  No smoking 24 hours prior to or 24 hours after surgery. 8. Wear loose pants or shorts- loose enough to fit over bandages, boots, and casts. 9. No slip on shoes, sneakers are best. 10. Bring  your boot with you to the surgery center/hospital.  Also bring crutches or a walker if your physician has prescribed it for you.  If you do not have this equipment, it will be provided for you after surgery. 11. If you have not been contracted by the surgery center/hospital by the day before your surgery, call to confirm the date and time of your surgery. 12. Leave-time from work may vary depending on the type of surgery you have.  Appropriate arrangements should be made prior to surgery with your employer. 13. Prescriptions will be provided immediately following surgery by your doctor.  Have these filled as soon as possible after surgery and take the medication as directed. 14. Remove nail polish on the operative foot. 15. Wash the night before surgery.  The night before surgery wash the foot and leg well with the antibacterial soap provided and water paying special attention to beneath the toenails and in between the toes.  Rinse thoroughly with water and dry well with a towel.  Perform this wash unless told not to do so by your physician.  Enclosed: 1 Ice pack (please put in freezer the night before surgery)   1 Hibiclens skin cleaner   Pre-op Instructions  If you have any questions regarding the instructions, do not hesitate to call our office.  [place]: [address] [place], [place] [address] [phone]  [place]: [address] [place], [place] [address] [phone]  [place]: [address]  [place], [place] [address] [phone]   Dr.   Rosaly Labarbera DPM, Dr. Matthew Wagoner DPM, Dr. M. Todd Hyatt DPM, Dr. Titorya Stover DPM 

## 2016-01-11 NOTE — Progress Notes (Signed)
Subjective:     Patient ID: Mackenzie Boone, female   DOB: 28-Jan-1981, 35 y.o.   MRN: [id number]  HPI patient states she's doing really well with her right foot and knows that she needs her left bunion fixed as it's been extremely tender   Review of Systems     Objective:   Physical Exam Neurovascular status intact negative Homans sign noted with good structural correction right foot with hallux in rectus position good range of motion and large hyperostosis medial aspect first metatarsal head left with redness and pain    Assessment:     Doing well with structural bunion correction right with structural bunion deformity left    Plan:     H&P conditions reviewed and recommended correction of the left foot. Explained surgery and risk and patient wants to get this fixed and I allowed her to review consent form line byline with ample opportunities for questions explaining alternative treatments complications. Understands total recovery take approximate 6 months signs consent form and is scheduled for outpatient surgery left  X-ray right indicates the osteotomy is healing well good position and is noted pins in place

## 2016-01-24 NOTE — Telephone Encounter (Signed)
I am calling to let you know that Dr. Charlsie Merlesegal will not be able to do your surgery on 05/07/2016.  Oh no, that is when my kids will be gone with their dad.  I was hoping to have it done then so I would not have to chase after them.  Is there another time around then that he can do it?"  He can do it on December 12 or January 2.  "Let me call you back.  I will have to decide when will be a good time."

## 2016-01-26 ENCOUNTER — Telehealth: Payer: Self-pay | Admitting: Podiatry

## 2016-01-26 ENCOUNTER — Other Ambulatory Visit: Payer: Self-pay

## 2016-01-26 MED ORDER — HYDROCODONE-ACETAMINOPHEN 5-325 MG PO TABS: 1.0000 | ORAL_TABLET | Freq: Four times a day (QID) | ORAL | 0 refills | Status: AC | PRN

## 2016-01-26 NOTE — Telephone Encounter (Signed)
Called pt and she stated she will be by this afternoon to pick up her Rx. I scheduled her to see Dr. Charlsie Merlesegal Monday 18 September at 10:30 am.

## 2016-01-26 NOTE — Telephone Encounter (Signed)
I will refill her pain medication, she can come to the office to pick it up, also get her scheduled for an evaluation within the next week. Thanks

## 2016-01-26 NOTE — Telephone Encounter (Signed)
Patient request a refill on her pain medication. She states that she may have sprained her ankle after rolling it and that it is also swollen. Pt had [place] Bunionectomy Right surgery on 01 August.

## 2016-01-29 ENCOUNTER — Ambulatory Visit (INDEPENDENT_AMBULATORY_CARE_PROVIDER_SITE_OTHER): Payer: Medicaid Other

## 2016-01-29 ENCOUNTER — Encounter: Payer: Self-pay | Admitting: Podiatry

## 2016-01-29 ENCOUNTER — Ambulatory Visit (INDEPENDENT_AMBULATORY_CARE_PROVIDER_SITE_OTHER): Payer: Medicaid Other | Admitting: Podiatry

## 2016-01-29 VITALS — BP 119/77 | HR 71 | Resp 16

## 2016-01-29 DIAGNOSIS — M216X9 Other acquired deformities of unspecified foot: Secondary | ICD-10-CM

## 2016-01-29 DIAGNOSIS — M25571 Pain in right ankle and joints of right foot: Secondary | ICD-10-CM

## 2016-01-29 DIAGNOSIS — M21619 Bunion of unspecified foot: Secondary | ICD-10-CM

## 2016-01-31 NOTE — Progress Notes (Signed)
Subjective:     Patient ID: Mackenzie Boone, female   DOB: [birth year], 35 y.o.   MRN: [id number]  HPI patient presents concerned because still having pain in the right ankle and states she turned her ankle foot is healing well   Review of Systems     Objective:   Physical Exam Neurovascular status intact muscle strength adequate with discomfort in the lateral aspect right ankle with mild increase in the motion of inversion but not significant    Assessment:     Sprained ankle with the possibility for trauma    Plan:     H&P x-rays reviewed and advised on importance of exercises support compression and elevation. Reappoint to recheck  X-ray was negative for signs of diastases injury or fracture

## 2019-07-29 ENCOUNTER — Ambulatory Visit: Payer: Managed Care, Other (non HMO) | Attending: Internal Medicine

## 2019-07-29 DIAGNOSIS — Z23 Encounter for immunization: Secondary | ICD-10-CM

## 2019-07-29 NOTE — Progress Notes (Signed)
   Covid-19 Vaccination Clinic  Name:  Mackenzie Boone    MRN: [id number] DOB: Sep 18, 1980  07/29/2019  [name] was observed post Covid-19 immunization for 15 minutes without incident. She was provided with Vaccine Information Sheet and instruction to access the V-Safe system.   [name] was instructed to call 911 with any severe reactions post vaccine: Marland Kitchen Difficulty breathing  . Swelling of face and throat  . A fast heartbeat  . A bad rash all over body  . Dizziness and weakness   Immunizations Administered    Name Date Dose VIS Date Route   Pfizer COVID-19 Vaccine 07/29/2019  1:55 PM 0.3 mL 04/23/2019 Intramuscular   Manufacturer: [employer], [employer]   Lot: [id number]   NDC: [address]-1

## 2019-08-23 ENCOUNTER — Ambulatory Visit: Payer: Managed Care, Other (non HMO) | Attending: Internal Medicine

## 2019-08-23 DIAGNOSIS — Z23 Encounter for immunization: Secondary | ICD-10-CM

## 2019-08-23 NOTE — Progress Notes (Signed)
   Covid-19 Vaccination Clinic  Name:  Mackenzie Boone    MRN: [id number] DOB: 26-Mar-1981  08/23/2019  [name] was observed post Covid-19 immunization for 15 minutes without incident. She was provided with Vaccine Information Sheet and instruction to access the V-Safe system.   [name] was instructed to call 911 with any severe reactions post vaccine: Marland Kitchen Difficulty breathing  . Swelling of face and throat  . A fast heartbeat  . A bad rash all over body  . Dizziness and weakness   Immunizations Administered    Name Date Dose VIS Date Route   Pfizer COVID-19 Vaccine 08/23/2019  3:00 PM 0.3 mL 04/23/2019 Intramuscular   Manufacturer: [employer], [employer]   Lot: [id number]   NDC: [address]-6

## 2020-01-13 ENCOUNTER — Other Ambulatory Visit: Payer: Managed Care, Other (non HMO)

## 2020-01-13 ENCOUNTER — Other Ambulatory Visit: Payer: Self-pay | Admitting: Critical Care Medicine

## 2020-01-13 DIAGNOSIS — Z20822 Contact with and (suspected) exposure to covid-19: Secondary | ICD-10-CM

## 2020-01-15 LAB — NOVEL CORONAVIRUS, NAA: SARS-CoV-2, NAA: NOT DETECTED
